# Patient Record
Sex: Female | Born: 1995 | Race: Black or African American | Hispanic: No | Marital: Single | Smoking: Never smoker
Health system: Southern US, Community
[De-identification: ages and names within clinical notes are randomized; demographics above are authoritative.]

## PROBLEM LIST (undated history)

## (undated) DIAGNOSIS — F909 Attention-deficit hyperactivity disorder, unspecified type: Secondary | ICD-10-CM

## (undated) HISTORY — PX: WISDOM TOOTH EXTRACTION: SHX21

---

## 2016-12-07 ENCOUNTER — Emergency Department (HOSPITAL_COMMUNITY): Payer: Worker's Compensation

## 2016-12-07 ENCOUNTER — Encounter (HOSPITAL_COMMUNITY): Payer: Self-pay | Admitting: *Deleted

## 2016-12-07 ENCOUNTER — Emergency Department (HOSPITAL_COMMUNITY)
Admission: EM | Admit: 2016-12-07 | Discharge: 2016-12-07 | Disposition: A | Discharge status: Home / Self Care | Payer: Worker's Compensation | Attending: Emergency Medicine | Admitting: Emergency Medicine

## 2016-12-07 DIAGNOSIS — Y939 Activity, unspecified: Secondary | ICD-10-CM | POA: Insufficient documentation

## 2016-12-07 DIAGNOSIS — S61412A Laceration without foreign body of left hand, initial encounter: Secondary | ICD-10-CM

## 2016-12-07 DIAGNOSIS — Y999 Unspecified external cause status: Secondary | ICD-10-CM | POA: Insufficient documentation

## 2016-12-07 DIAGNOSIS — W25XXXA Contact with sharp glass, initial encounter: Secondary | ICD-10-CM | POA: Diagnosis not present

## 2016-12-07 DIAGNOSIS — Y929 Unspecified place or not applicable: Secondary | ICD-10-CM | POA: Diagnosis not present

## 2016-12-07 HISTORY — DX: Attention-deficit hyperactivity disorder, unspecified type: F90.9

## 2016-12-07 MED ORDER — CEPHALEXIN 500 MG PO CAPS: 500.0000 mg | ORAL_CAPSULE | Freq: Four times a day (QID) | ORAL | 0 refills | Status: DC

## 2016-12-07 MED ORDER — LIDOCAINE HCL 1 % IJ SOLN
INTRAMUSCULAR | Status: AC
  Administered 2016-12-07: 20 mL
  Filled 2016-12-07: qty 20

## 2016-12-07 MED ORDER — BACITRACIN ZINC 500 UNIT/GM EX OINT
TOPICAL_OINTMENT | CUTANEOUS | Status: AC
  Administered 2016-12-07: 1
  Filled 2016-12-07: qty 0.9

## 2016-12-07 NOTE — ED Triage Notes (Signed)
Pt works at [employer].  A glass was dropped on the floor and pt fell on glass slicing her left hand.  Pt denies hitting her head.  Some bleeding still noted.  Pt's hand placed in NaCl.  Pt states her tetanus is up to date.  Pt a/o x 4 and ambulatory.

## 2016-12-07 NOTE — ED Provider Notes (Signed)
[facility]-EMERGENCY DEPT Provider Note   CSN: [id number] Arrival date & time: 12/07/16  2051     History   Chief Complaint No chief complaint on file.   HPI Jodi James is a 21 y.o. female who presents to the ED with a laceration to the left hand. Patient states that she dropped a glass and it broke cutting her hand.   The history is provided by the patient. No language interpreter was used.  Laceration   The incident occurred 1 to 2 hours ago. The laceration is located on the left hand. The laceration is 3 cm in size. The laceration mechanism was a broken glass. The pain is mild. The pain has been constant since onset. Her tetanus status is UTD.    Past Medical History:  Diagnosis Date  . ADHD     There are no active problems to display for this patient.   Past Surgical History:  Procedure Laterality Date  . WISDOM TOOTH EXTRACTION      OB History    No data available       Home Medications    Prior to Admission medications   Medication Sig Start Date End Date Taking? Authorizing Provider  cephALEXin (KEFLEX) 500 MG capsule Take 1 capsule (500 mg total) by mouth 4 (four) times daily. 12/07/16   Janne NapoleonNeese, Traycen Goyer M, NP    Family History No family history on file.  Social History Social History  Substance Use Topics  . Smoking status: Never Smoker  . Smokeless tobacco: Never Used  . Alcohol use Yes     Comment: occasional     Allergies   Sulfa antibiotics   Review of Systems Review of Systems  Musculoskeletal: Positive for arthralgias.  Skin: Positive for wound.  All other systems reviewed and are negative.    Physical Exam Updated Vital Signs BP 108/75 (BP Location: Left Arm)   Pulse 82   Temp 98.6 F (37 C) (Oral)   Resp 18   LMP 12/07/2016   SpO2 100%   Physical Exam  Constitutional: She appears well-developed and well-nourished. No distress.  HENT:  Head: Normocephalic.  Eyes: EOM are normal.  Neck: Neck  supple.  Cardiovascular: Normal rate.   Pulmonary/Chest: Effort normal.  Musculoskeletal: Normal range of motion.       Left hand: She exhibits tenderness and laceration. She exhibits normal range of motion and normal capillary refill. Normal sensation noted. Normal strength noted. She exhibits no thumb/finger opposition.       Hands: There is a laceration to the palmar aspect of the left hand and the base of the left thumb. No tendon visualized. Patient has full flexion and extension of the thumb and good strength.  Neurological: She is alert.  Skin: Skin is warm and dry.  Psychiatric: She has a normal mood and affect.  Nursing note and vitals reviewed.    ED Treatments / Results  Labs (all labs ordered are listed, but only abnormal results are displayed) Labs Reviewed - No data to display   Radiology Dg Hand Complete Left  Result Date: 12/07/2016 CLINICAL DATA:  Laceration adjacent to the left thumb MCP joint. EXAM: LEFT HAND - COMPLETE 3+ VIEW COMPARISON:  None. FINDINGS: There is no evidence of fracture or dislocation. No radiopaque foreign body is identified. There is no evidence of arthropathy or other focal bone abnormality. Soft tissue laceration at the base of the thumb with minimal soft tissue emphysema. IMPRESSION: Negative for acute fracture or  foreign bodies. No joint dislocations. Soft tissue laceration at the base of the thumb. Electronically Signed   By: Tollie Eth M.D.   On: 12/07/2016 22:29    Procedures .Marland KitchenLaceration Repair Date/Time: 12/07/2016 10:41 PM Performed by: Janne Napoleon Authorized by: Janne Napoleon   Consent:    Consent obtained:  Verbal   Consent given by:  Patient   Risks discussed:  Infection and need for additional repair Anesthesia (see MAR for exact dosages):    Anesthesia method:  Local infiltration   Local anesthetic:  Lidocaine 1% w/o epi Laceration details:    Location:  Hand   Hand location:  L palm   Length (cm):  3 Repair type:      Repair type:  Simple Pre-procedure details:    Preparation:  Patient was prepped and draped in usual sterile fashion and imaging obtained to evaluate for foreign bodies Exploration:    Wound exploration: wound explored through full range of motion and entire depth of wound probed and visualized     Wound extent: no foreign bodies/material noted, no nerve damage noted, no tendon damage noted and no underlying fracture noted     Contaminated: no   Treatment:    Area cleansed with:  Betadine   Irrigation solution:  Sterile saline   Irrigation method:  Syringe Skin repair:    Repair method:  Sutures   Suture size:  4-0   Suture material:  Prolene   Suture technique:  Simple interrupted   Number of sutures:  4 Approximation:    Approximation:  Close   Vermilion border: well-aligned   Post-procedure details:    Dressing:  Antibiotic ointment, sterile dressing and splint for protection Comments:     Tetanus is up to date   (including critical care time)  Medications Ordered in ED Medications  lidocaine (XYLOCAINE) 1 % (with pres) injection (20 mLs  Given by Other 12/07/16 2237)  bacitracin 500 UNIT/GM ointment (1 application  Given 12/07/16 2242)     Initial Impression / Assessment and Plan / ED Course  I have reviewed the triage vital signs and the nursing notes. 21 y.o. female with laceration to the left hand stable for d/c without focal neuro deficits. She will f/u in 10 days for suture removal. She will return sooner for any problems.   Final Clinical Impressions(s) / ED Diagnoses   Final diagnoses:  Laceration of left hand without foreign body, initial encounter    New Prescriptions New Prescriptions   CEPHALEXIN (KEFLEX) 500 MG CAPSULE    Take 1 capsule (500 mg total) by mouth 4 (four) times daily.     Kerrie [place], [place] 12/07/16 2252    Rolland Porter, MD 12/26/16 [phone]

## 2016-12-07 NOTE — Discharge Instructions (Signed)
You will need to follow up with your doctor, urgent care or return here in 10 days for suture removal. If you develop numbness, decreased range of motion or other problems follow up with Dr. Magnus IvanBlackman. Take tylenol and ibuprofen as needed for pain.

## 2016-12-17 ENCOUNTER — Emergency Department (HOSPITAL_COMMUNITY)
Admission: EM | Admit: 2016-12-17 | Discharge: 2016-12-17 | Discharge status: Against Medical Advice | Payer: Self-pay | Attending: Emergency Medicine | Admitting: Emergency Medicine

## 2016-12-17 ENCOUNTER — Encounter (HOSPITAL_COMMUNITY): Payer: Self-pay | Admitting: *Deleted

## 2016-12-17 ENCOUNTER — Ambulatory Visit (HOSPITAL_COMMUNITY)
Admission: EM | Admit: 2016-12-17 | Discharge: 2016-12-17 | Disposition: A | Discharge status: Other Acute Inpatient Hospital | Payer: Self-pay

## 2016-12-17 DIAGNOSIS — Z5321 Procedure and treatment not carried out due to patient leaving prior to being seen by health care provider: Secondary | ICD-10-CM | POA: Insufficient documentation

## 2016-12-17 NOTE — ED Triage Notes (Signed)
Pt here for suture removal. Pt has left thumb laceration 10 days ago while at work. Pt states she was unable to bend IP thumb after the laceration and is still unable to bend thumb at IP joint.

## 2016-12-24 ENCOUNTER — Other Ambulatory Visit: Payer: Self-pay | Admitting: Orthopedic Surgery

## 2016-12-25 ENCOUNTER — Encounter (HOSPITAL_BASED_OUTPATIENT_CLINIC_OR_DEPARTMENT_OTHER)
Admission: RE | Disposition: A | Discharge status: Home / Self Care | Payer: Self-pay | Source: Ambulatory Visit | Attending: Orthopedic Surgery

## 2016-12-25 ENCOUNTER — Ambulatory Visit (HOSPITAL_BASED_OUTPATIENT_CLINIC_OR_DEPARTMENT_OTHER)
Admission: RE | Admit: 2016-12-25 | Discharge: 2016-12-25 | Disposition: A | Discharge status: Home / Self Care | Payer: Worker's Compensation | Source: Ambulatory Visit | Attending: Orthopedic Surgery | Admitting: Orthopedic Surgery

## 2016-12-25 ENCOUNTER — Other Ambulatory Visit: Payer: Self-pay

## 2016-12-25 ENCOUNTER — Encounter (HOSPITAL_BASED_OUTPATIENT_CLINIC_OR_DEPARTMENT_OTHER): Payer: Self-pay

## 2016-12-25 ENCOUNTER — Ambulatory Visit (HOSPITAL_BASED_OUTPATIENT_CLINIC_OR_DEPARTMENT_OTHER): Payer: Worker's Compensation | Admitting: Anesthesiology

## 2016-12-25 DIAGNOSIS — W25XXXA Contact with sharp glass, initial encounter: Secondary | ICD-10-CM | POA: Insufficient documentation

## 2016-12-25 DIAGNOSIS — Z881 Allergy status to other antibiotic agents status: Secondary | ICD-10-CM | POA: Diagnosis not present

## 2016-12-25 DIAGNOSIS — S66022A Laceration of long flexor muscle, fascia and tendon of left thumb at wrist and hand level, initial encounter: Secondary | ICD-10-CM | POA: Diagnosis not present

## 2016-12-25 DIAGNOSIS — E669 Obesity, unspecified: Secondary | ICD-10-CM | POA: Diagnosis not present

## 2016-12-25 DIAGNOSIS — S6432XA Injury of digital nerve of left thumb, initial encounter: Secondary | ICD-10-CM | POA: Diagnosis not present

## 2016-12-25 DIAGNOSIS — Z6832 Body mass index (BMI) 32.0-32.9, adult: Secondary | ICD-10-CM | POA: Diagnosis not present

## 2016-12-25 DIAGNOSIS — Z882 Allergy status to sulfonamides status: Secondary | ICD-10-CM | POA: Insufficient documentation

## 2016-12-25 DIAGNOSIS — S61012A Laceration without foreign body of left thumb without damage to nail, initial encounter: Secondary | ICD-10-CM | POA: Diagnosis present

## 2016-12-25 DIAGNOSIS — S65412A Laceration of blood vessel of left thumb, initial encounter: Secondary | ICD-10-CM | POA: Insufficient documentation

## 2016-12-25 HISTORY — PX: NERVE REPAIR: SHX2083

## 2016-12-25 HISTORY — PX: FLEXOR TENDON REPAIR: SHX6501

## 2016-12-25 LAB — POCT PREGNANCY, URINE: Preg Test, Ur: NEGATIVE

## 2016-12-25 SURGERY — REPAIR, TENDON, FLEXOR
Anesthesia: General | Site: Thumb | Laterality: Left

## 2016-12-25 MED ORDER — PROPOFOL 10 MG/ML IV BOLUS
INTRAVENOUS | Status: DC | PRN
  Administered 2016-12-25: 200 mg via INTRAVENOUS

## 2016-12-25 MED ORDER — CEFAZOLIN SODIUM-DEXTROSE 2-4 GM/100ML-% IV SOLN
INTRAVENOUS | Status: AC
  Filled 2016-12-25: qty 100

## 2016-12-25 MED ORDER — PROMETHAZINE HCL 25 MG/ML IJ SOLN: 6.2500 mg | INTRAMUSCULAR | Status: DC | PRN

## 2016-12-25 MED ORDER — FENTANYL CITRATE (PF) 100 MCG/2ML IJ SOLN
INTRAMUSCULAR | Status: AC
  Filled 2016-12-25: qty 2

## 2016-12-25 MED ORDER — CHLORHEXIDINE GLUCONATE 4 % EX LIQD: 60.0000 mL | Freq: Once | CUTANEOUS | Status: DC

## 2016-12-25 MED ORDER — FENTANYL CITRATE (PF) 100 MCG/2ML IJ SOLN: 25.0000 ug | INTRAMUSCULAR | Status: DC | PRN

## 2016-12-25 MED ORDER — OXYCODONE HCL 5 MG PO TABS
5.0000 mg | ORAL_TABLET | Freq: Once | ORAL | Status: AC
  Administered 2016-12-25: 5 mg via ORAL

## 2016-12-25 MED ORDER — DEXAMETHASONE SODIUM PHOSPHATE 10 MG/ML IJ SOLN
INTRAMUSCULAR | Status: DC | PRN
  Administered 2016-12-25: 10 mg via INTRAVENOUS

## 2016-12-25 MED ORDER — FENTANYL CITRATE (PF) 100 MCG/2ML IJ SOLN
INTRAMUSCULAR | Status: DC | PRN
  Administered 2016-12-25: 50 ug via INTRAVENOUS
  Administered 2016-12-25 (×2): 25 ug via INTRAVENOUS
  Administered 2016-12-25: 50 ug via INTRAVENOUS

## 2016-12-25 MED ORDER — ONDANSETRON HCL 4 MG/2ML IJ SOLN
INTRAMUSCULAR | Status: DC | PRN
  Administered 2016-12-25: 4 mg via INTRAVENOUS

## 2016-12-25 MED ORDER — MIDAZOLAM HCL 2 MG/2ML IJ SOLN
INTRAMUSCULAR | Status: AC
  Filled 2016-12-25: qty 2

## 2016-12-25 MED ORDER — MIDAZOLAM HCL 5 MG/5ML IJ SOLN
INTRAMUSCULAR | Status: DC | PRN
  Administered 2016-12-25: 2 mg via INTRAVENOUS

## 2016-12-25 MED ORDER — OXYCODONE HCL 5 MG PO TABS
ORAL_TABLET | ORAL | Status: AC
  Filled 2016-12-25: qty 1

## 2016-12-25 MED ORDER — HYDROCODONE-ACETAMINOPHEN 5-325 MG PO TABS: ORAL_TABLET | ORAL | 0 refills | Status: DC

## 2016-12-25 MED ORDER — LACTATED RINGERS IV SOLN
INTRAVENOUS | Status: DC | PRN
  Administered 2016-12-25 (×2): via INTRAVENOUS

## 2016-12-25 MED ORDER — BUPIVACAINE HCL (PF) 0.25 % IJ SOLN
INTRAMUSCULAR | Status: DC | PRN
  Administered 2016-12-25: 10 mL

## 2016-12-25 MED ORDER — CEFAZOLIN SODIUM-DEXTROSE 2-4 GM/100ML-% IV SOLN
2.0000 g | INTRAVENOUS | Status: AC
  Administered 2016-12-25: 2 g via INTRAVENOUS

## 2016-12-25 MED ORDER — LIDOCAINE HCL (CARDIAC) 20 MG/ML IV SOLN
INTRAVENOUS | Status: DC | PRN
  Administered 2016-12-25: 60 mg via INTRAVENOUS

## 2016-12-25 SURGICAL SUPPLY — 79 items
BAG DECANTER FOR FLEXI CONT (MISCELLANEOUS) IMPLANT
BANDAGE ACE 3X5.8 VEL STRL LF (GAUZE/BANDAGES/DRESSINGS) ×4 IMPLANT
BLADE MINI RND TIP GREEN BEAV (BLADE) IMPLANT
BLADE SURG 15 STRL LF DISP TIS (BLADE) ×4 IMPLANT
BLADE SURG 15 STRL SS (BLADE) ×4
BNDG CONFORM 2 STRL LF (GAUZE/BANDAGES/DRESSINGS) IMPLANT
BNDG ELASTIC 2X5.8 VLCR STR LF (GAUZE/BANDAGES/DRESSINGS) IMPLANT
BNDG ESMARK 4X9 LF (GAUZE/BANDAGES/DRESSINGS) ×4 IMPLANT
BNDG GAUZE ELAST 4 BULKY (GAUZE/BANDAGES/DRESSINGS) ×4 IMPLANT
CATH ROBINSON RED A/P 10FR (CATHETERS) IMPLANT
CHLORAPREP W/TINT 26ML (MISCELLANEOUS) ×4 IMPLANT
CONNECTOR NERVE AXOGUARD 3X15 (Orthopedic Implant) ×4 IMPLANT
CORD BIPOLAR FORCEPS 12FT (ELECTRODE) ×4 IMPLANT
COTTONBALL LRG STERILE PKG (GAUZE/BANDAGES/DRESSINGS) IMPLANT
COVER BACK TABLE 60X90IN (DRAPES) ×4 IMPLANT
COVER MAYO STAND STRL (DRAPES) ×4 IMPLANT
CUFF TOURNIQUET SINGLE 18IN (TOURNIQUET CUFF) ×4 IMPLANT
DECANTER SPIKE VIAL GLASS SM (MISCELLANEOUS) IMPLANT
DRAIN TLS ROUND 10FR (DRAIN) IMPLANT
DRAPE EXTREMITY T 121X128X90 (DRAPE) ×4 IMPLANT
DRAPE OEC MINIVIEW 54X84 (DRAPES) IMPLANT
DRAPE SURG 17X23 STRL (DRAPES) ×4 IMPLANT
DRSG PAD ABDOMINAL 8X10 ST (GAUZE/BANDAGES/DRESSINGS) IMPLANT
GAUZE SPONGE 4X4 12PLY STRL (GAUZE/BANDAGES/DRESSINGS) ×4 IMPLANT
GAUZE SPONGE 4X4 16PLY XRAY LF (GAUZE/BANDAGES/DRESSINGS) IMPLANT
GAUZE XEROFORM 1X8 LF (GAUZE/BANDAGES/DRESSINGS) ×4 IMPLANT
GLOVE BIO SURGEON STRL SZ7.5 (GLOVE) ×4 IMPLANT
GLOVE BIOGEL PI IND STRL 7.0 (GLOVE) ×2 IMPLANT
GLOVE BIOGEL PI IND STRL 8 (GLOVE) ×2 IMPLANT
GLOVE BIOGEL PI IND STRL 8.5 (GLOVE) ×2 IMPLANT
GLOVE BIOGEL PI INDICATOR 7.0 (GLOVE) ×2
GLOVE BIOGEL PI INDICATOR 8 (GLOVE) ×2
GLOVE BIOGEL PI INDICATOR 8.5 (GLOVE) ×2
GLOVE ECLIPSE 6.5 STRL STRAW (GLOVE) ×4 IMPLANT
GLOVE SURG ORTHO 8.0 STRL STRW (GLOVE) ×4 IMPLANT
GOWN STRL REUS W/ TWL LRG LVL3 (GOWN DISPOSABLE) ×2 IMPLANT
GOWN STRL REUS W/TWL LRG LVL3 (GOWN DISPOSABLE) ×2
GOWN STRL REUS W/TWL XL LVL3 (GOWN DISPOSABLE) ×8 IMPLANT
K-WIRE .035X4 (WIRE) IMPLANT
LOOP VESSEL MAXI BLUE (MISCELLANEOUS) IMPLANT
NEEDLE HYPO 25X1 1.5 SAFETY (NEEDLE) ×4 IMPLANT
NEEDLE KEITH (NEEDLE) IMPLANT
NS IRRIG 1000ML POUR BTL (IV SOLUTION) ×4 IMPLANT
PACK BASIN DAY SURGERY FS (CUSTOM PROCEDURE TRAY) ×4 IMPLANT
PAD CAST 3X4 CTTN HI CHSV (CAST SUPPLIES) ×2 IMPLANT
PAD CAST 4YDX4 CTTN HI CHSV (CAST SUPPLIES) IMPLANT
PADDING CAST ABS 3INX4YD NS (CAST SUPPLIES)
PADDING CAST ABS 4INX4YD NS (CAST SUPPLIES) ×2
PADDING CAST ABS COTTON 3X4 (CAST SUPPLIES) IMPLANT
PADDING CAST ABS COTTON 4X4 ST (CAST SUPPLIES) ×2 IMPLANT
PADDING CAST COTTON 3X4 STRL (CAST SUPPLIES) ×2
PADDING CAST COTTON 4X4 STRL (CAST SUPPLIES)
SLEEVE SCD COMPRESS KNEE MED (MISCELLANEOUS) ×4 IMPLANT
SPLINT PLASTER CAST XFAST 3X15 (CAST SUPPLIES) ×30 IMPLANT
SPLINT PLASTER CAST XFAST 4X15 (CAST SUPPLIES) IMPLANT
SPLINT PLASTER XTRA FAST SET 4 (CAST SUPPLIES)
SPLINT PLASTER XTRA FASTSET 3X (CAST SUPPLIES) ×30
STOCKINETTE 4X48 STRL (DRAPES) ×4 IMPLANT
SUT CHROMIC 5 0 P 3 (SUTURE) IMPLANT
SUT ETHIBOND 3-0 V-5 (SUTURE) IMPLANT
SUT ETHILON 3 0 PS 1 (SUTURE) IMPLANT
SUT ETHILON 4 0 PS 2 18 (SUTURE) ×4 IMPLANT
SUT ETHILON 9 0 BV100 4 (SUTURE) ×4 IMPLANT
SUT FIBERWIRE 4-0 18 DIAM BLUE (SUTURE)
SUT MERSILENE 2.0 SH NDLE (SUTURE) IMPLANT
SUT MERSILENE 4 0 P 3 (SUTURE) IMPLANT
SUT PROLENE 2 0 SH DA (SUTURE) IMPLANT
SUT PROLENE 5 0 P 3 (SUTURE) IMPLANT
SUT SILK 4 0 PS 2 (SUTURE) ×4 IMPLANT
SUT SUPRAMID 4-0 (SUTURE) ×4 IMPLANT
SUT VIC AB 4-0 P-3 18XBRD (SUTURE) IMPLANT
SUT VIC AB 4-0 P3 18 (SUTURE)
SUT VICRYL 4-0 PS2 18IN ABS (SUTURE) IMPLANT
SUTURE FIBERWR 4-0 18 DIA BLUE (SUTURE) IMPLANT
SYR BULB 3OZ (MISCELLANEOUS) ×4 IMPLANT
SYR CONTROL 10ML LL (SYRINGE) IMPLANT
TOWEL OR 17X24 6PK STRL BLUE (TOWEL DISPOSABLE) ×8 IMPLANT
TUBE FEEDING ENTERAL 5FR 16IN (TUBING) ×4 IMPLANT
UNDERPAD 30X30 (UNDERPADS AND DIAPERS) ×4 IMPLANT

## 2016-12-25 NOTE — Anesthesia Preprocedure Evaluation (Signed)
Anesthesia Evaluation  Patient identified by MRN, date of birth, ID band Patient awake    Reviewed: Allergy & Precautions, NPO status , Patient's Chart, lab work & pertinent test results  Airway Mallampati: II  TM Distance: >3 FB Neck ROM: Full    Dental  (+) Teeth Intact, Dental Advisory Given   Pulmonary neg pulmonary ROS,    Pulmonary exam normal breath sounds clear to auscultation       Cardiovascular Exercise Tolerance: Good negative cardio ROS Normal cardiovascular exam Rhythm:Regular Rate:Normal     Neuro/Psych negative neurological ROS     GI/Hepatic negative GI ROS, Neg liver ROS,   Endo/Other  Obesity   Renal/GU negative Renal ROS     Musculoskeletal negative musculoskeletal ROS (+)   Abdominal   Peds  (+) ADHD Hematology negative hematology ROS (+)   Anesthesia Other Findings Day of surgery medications reviewed with the patient.  Reproductive/Obstetrics negative OB ROS                             Anesthesia Physical Anesthesia Plan  ASA: II  Anesthesia Plan: General   Post-op Pain Management:    Induction: Intravenous  PONV Risk Score and Plan: 3  Airway Management Planned: LMA  Additional Equipment:   Intra-op Plan:   Post-operative Plan: Extubation in OR  Informed Consent: I have reviewed the patients History and Physical, chart, labs and discussed the procedure including the risks, benefits and alternatives for the proposed anesthesia with the patient or authorized representative who has indicated his/her understanding and acceptance.   Dental advisory given  Plan Discussed with: CRNA  Anesthesia Plan Comments: (Risks/benefits of general anesthesia discussed with patient including risk of damage to teeth, lips, gum, and tongue, nausea/vomiting, allergic reactions to medications, and the possibility of heart attack, stroke and death.  All patient questions  answered.  Patient wishes to proceed.)        Anesthesia Quick Evaluation

## 2016-12-25 NOTE — Transfer of Care (Signed)
Immediate Anesthesia Transfer of Care Note  Patient: Jodi James  Procedure(s) Performed: LEFT THUMB REPAIR FLEXOR TENDON AND DIGITAL NERVE (Left Thumb) NERVE REPAIR (Left Finger)  Patient Location: PACU  Anesthesia Type:General  Level of Consciousness: awake and sedated  Airway & Oxygen Therapy: Patient Spontanous Breathing and Patient connected to face mask oxygen  Post-op Assessment: Report given to RN and Post -op Vital signs reviewed and stable  Post vital signs: Reviewed and stable  Last Vitals:  Vitals:   12/25/16 1246  BP: (!) 101/50  Pulse: 67  Resp: 18  Temp: 36.9 C  SpO2: 100%    Last Pain:  Vitals:   12/25/16 1246  PainSc: 0-No pain         Complications: No apparent anesthesia complications

## 2016-12-25 NOTE — H&P (Signed)
  Jodi James is an 21 y.o. female.   Chief Complaint: left thumb laceration HPI: 21 yo rhd female states she lacerated left thumb at work 12/07/16 on glass.  Seen at ED where wound cleaned and sutured.  Unable to flex ip joint.  Decreased sensation on radial side of thumb.  She wishes to have operative exploration and repair tendon/artery/nerve as necessary.  Allergies:  Allergies  Allergen Reactions  . Sulfa Antibiotics Swelling    Lips swell    Past Medical History:  Diagnosis Date  . ADHD     Past Surgical History:  Procedure Laterality Date  . WISDOM TOOTH EXTRACTION      Family History: History reviewed. No pertinent family history.  Social History:   reports that  has never smoked. she has never used smokeless tobacco. She reports that she drinks alcohol. She reports that she does not use drugs.  Medications: Medications Prior to Admission  Medication Sig Dispense Refill  . cephALEXin (KEFLEX) 500 MG capsule Take 1 capsule (500 mg total) by mouth 4 (four) times daily. 20 capsule 0    No results found for this or any previous visit (from the past 48 hour(s)).  No results found.   A comprehensive review of systems was negative.  Blood pressure (!) 101/50, pulse 67, temperature 98.4 F (36.9 C), resp. rate 18, height 5\' 2"  (1.575 m), weight 81.5 kg (179 lb 9.6 oz), last menstrual period 12/07/2016, SpO2 100 %.  General appearance: alert, cooperative and appears stated age Head: Normocephalic, without obvious abnormality, atraumatic Neck: supple, symmetrical, trachea midline Resp: clear to auscultation bilaterally Cardio: regular rate and rhythm GI: non-tender Extremities: Intact sensation and capillary refill all digits.  +epl/fpl/io.  Healed laceration base left thumb.  Unable to flex ip joint left thumb.  Decreased sensation radial side left thumb. Pulses: 2+ and symmetric Skin: Skin color, texture, turgor normal. No rashes or lesions Neurologic: Grossly  normal Incision/Wound: as above  Assessment/Plan Left thumb laceration with tendon and digital nerve lacerations.  Recommend OR for exploration with repair tendon/artery/nerve as necessary.  Risks, benefits, and alternatives of surgery were discussed and the patient agrees with the plan of care.   Ziquan Fidel R 12/25/2016, 3:01 PM

## 2016-12-25 NOTE — Op Note (Signed)
[id number] 

## 2016-12-25 NOTE — Op Note (Signed)
I assisted Surgeon(s) and Role:    * Betha LoaKuzma, Kevin, MD - Primary    Cindee Salt* Kalee Broxton, MD - Assisting on the Procedure(s): LEFT THUMB REPAIR FLEXOR TENDON AND DIGITAL NERVE NERVE REPAIR on 12/25/2016.  I provided assistance on this case as follows: setup approach, debridement, identification of the laceration artery nerve and tendon, identification of the proximal tendon, repair of artery, tendon and nerve, including use of the operative microscope,closure of the wound, application of dressings and splints. I was present for the entire case.  Electronically signed by: Nicki ReaperKUZMA,Cerise Lieber R, MD Date: 12/25/2016 Time: 4:37 PM

## 2016-12-25 NOTE — Brief Op Note (Signed)
12/25/2016  4:35 PM  PATIENT:  Jodi James  21 y.o. female  PRE-OPERATIVE DIAGNOSIS:  Left thumb laceration  POST-OPERATIVE DIAGNOSIS:  Left thumb laceration  PROCEDURE:  Procedure(s): LEFT THUMB REPAIR FLEXOR TENDON AND DIGITAL NERVE (Left) NERVE REPAIR (Left)  SURGEON:  Surgeon(s) and Role:    * Betha LoaKuzma, Krithi Bray, MD - Primary    * Cindee SaltKuzma, Gary, MD - Assisting  PHYSICIAN ASSISTANT:   ASSISTANTS: Cindee SaltGary Wahid Holley, MD   ANESTHESIA:   general  EBL:  5 mL   BLOOD ADMINISTERED:none  DRAINS: none   LOCAL MEDICATIONS USED:  MARCAINE     SPECIMEN:  No Specimen  DISPOSITION OF SPECIMEN:  N/A  COUNTS:  YES  TOURNIQUET:   Total Tourniquet Time Documented: Upper Arm (Left) - 70 minutes Total: Upper Arm (Left) - 70 minutes   DICTATION: .Other Dictation: Dictation Number [id number]  PLAN OF CARE: Discharge to home after PACU  PATIENT DISPOSITION:  PACU - hemodynamically stable.

## 2016-12-25 NOTE — Anesthesia Procedure Notes (Signed)
Procedure Name: LMA Insertion Date/Time: 12/25/2016 3:09 PM Performed by: Sheryn BisonBlocker, Sanvika Cuttino D, CRNA Pre-anesthesia Checklist: Patient identified, Emergency Drugs available, Suction available and Patient being monitored Patient Re-evaluated:Patient Re-evaluated prior to induction Oxygen Delivery Method: Circle system utilized Preoxygenation: Pre-oxygenation with 100% oxygen Induction Type: IV induction Ventilation: Mask ventilation without difficulty LMA: LMA inserted LMA Size: 3.0 Number of attempts: 1 Airway Equipment and Method: Bite block Placement Confirmation: positive ETCO2 Tube secured with: Tape Dental Injury: Teeth and Oropharynx as per pre-operative assessment

## 2016-12-25 NOTE — Discharge Instructions (Addendum)

## 2016-12-26 ENCOUNTER — Encounter (HOSPITAL_BASED_OUTPATIENT_CLINIC_OR_DEPARTMENT_OTHER): Payer: Self-pay | Admitting: Orthopedic Surgery

## 2016-12-26 NOTE — Anesthesia Postprocedure Evaluation (Signed)
Anesthesia Post Note  Patient: Jodi James  Procedure(s) Performed: LEFT THUMB REPAIR FLEXOR TENDON AND DIGITAL NERVE (Left Thumb) NERVE REPAIR (Left Finger)     Patient location during evaluation: PACU Anesthesia Type: General Level of consciousness: awake and alert Pain management: pain level controlled Vital Signs Assessment: post-procedure vital signs reviewed and stable Respiratory status: spontaneous breathing, nonlabored ventilation and respiratory function stable Cardiovascular status: blood pressure returned to baseline and stable Postop Assessment: no apparent nausea or vomiting Anesthetic complications: no    Last Vitals:  Vitals:   12/25/16 1715 12/25/16 1731  BP: 117/78 117/64  Pulse: 97 92  Resp: (!) 22 (!) 22  Temp:  36.8 C  SpO2: 99% 100%    Last Pain:  Vitals:   12/25/16 1715  PainSc: 7                  Cecile HearingStephen Edward Lolly Glaus

## 2016-12-26 NOTE — Op Note (Signed)
NAMVern Claude:  Kropf, Florean                 ACCOUNT NO.:  [id number]  MEDICAL RECORD NO.:  [id number]  LOCATION:                                 FACILITY:  PHYSICIAN:  Betha LoaKevin Donette Mainwaring, MD        DATE OF BIRTH:  1996/02/14  DATE OF PROCEDURE:  12/25/2016 DATE OF DISCHARGE:                              OPERATIVE REPORT   PREOPERATIVE DIAGNOSES:  Left thumb flexor tendon laceration and digital nerve laceration.  POSTOPERATIVE DIAGNOSES:  Left thumb flexor pollicis longus laceration zone 2, radial digital nerve laceration, and radial digital artery laceration.  PROCEDURES:   1. Left thumb repair of FPL zone 2 laceration 2. Left thumb repair of radial digital artery under microscope 3. Left thumb repair of radial digital nerve under microscope with nerve tube.  SURGEON:  Betha LoaKevin Isobella Ascher, MD.  ASSISTANT:  Cindee SaltGary Teandre Hamre, MD.  ANESTHESIA:  General.  IV FLUIDS:  Per anesthesia flow sheet.  ESTIMATED BLOOD LOSS:  Minimal.  COMPLICATIONS:  None.  SPECIMENS:  None.  TOURNIQUET TIME:  70 minutes.  DISPOSITION:  Stable to PACU.  INDICATIONS:  Ms. Jodi James is a 21 year old female who approximately 2 weeks ago sustained a laceration to the base of her left thumb.  She was seen at the emergency department where the wound was irrigated and sutured closed.  She has had inability to flex the IP joint of the thumb and decreased sensation on the radial side.  I recommended operative exploration with repair of tendon, artery, and nerve as necessary. Risks, benefits, and alternatives of surgery were discussed including the risk of blood loss, infection, damage to nerves, vessels, tendons, ligaments, and bone, failure of surgery, need for additional surgery, complications with wound healing, continued pain, and stiffness.  She voiced understanding of these risks and elected to proceed.  OPERATIVE COURSE:  After being identified preoperatively by myself, the patient and I agreed upon the procedure and site of  procedure.  Surgical site was marked.  The risks, benefits, and alternatives of surgery were reviewed and she wished to proceed.  Surgical consent had been signed. She was given IV Ancef as preoperative antibiotic prophylaxis.  She was transferred to the operating room and placed on the operating room table in supine position with the left upper extremity on an arm board. General anesthesia was induced by anesthesiologist.  The left upper extremity was prepped and draped in a normal sterile orthopedic fashion. Surgical pause was performed between surgeons, Anesthesia, and operating room staff; and all were in agreement as to the patient, procedure and site of procedure.  Tourniquet at the proximal aspect of the extremity was inflated to 250 mmHg after exsanguination of the limb with an Esmarch bandage.  Incision was made in a Brunner-type fashion including the traumatic portion of the wound.  This was carried into subcutaneous tissues by spreading technique.  Complete laceration of the FPL tendon was noted.  The radial digital nerve and artery were lacerated.  The ulnar digital nerve was intact.  The FPL tendon was able to be retrieved from an incision at the wrist.  It was then able to be passed back through its course with a  pediatric feeding tube as a guide.  The FPL tendon was then repaired in a modified Kessler technique using a 4-0 looped Supramid suture.  This was augmented with a running epitendinous 6-0 Prolene suture.  Good reposition was obtained.  The tendon was able to glide within the sheath appropriately.  The microscope was brought in.  The radial digital artery was identified.  It was trimmed back to appropriate ends.  The lumen was opened.  A 9-0 nylon suture was used in an interrupted circumferential fashion to reapproximate the ends.  The ends of the radial digital nerve were freshened.  This was not able to be reapproximated without undue tension.  An AxoGen nerve tube  was then selected and the nerve placed into the tube and sutured in place.  The tube was filled with sterile saline.  The wounds were then copiously irrigated with sterile saline and closed with 4-0 nylon in a horizontal mattress fashion.  The wounds were injected with 10 mL of 0.25% plain Marcaine to aid in postoperative analgesia.  They were dressed with sterile Xeroform and 4 x 4's and wrapped with a Kerlix bandage.  A dorsal blocking splint with the wrist at 30-40 degrees flexion and the thumb flexed at the MP was placed and wrapped with Kerlix and Ace bandage.  Tourniquet was deflated at 70 minutes.  Fingertips were pink with brisk capillary refill after deflation of tourniquet.  The operative drapes were broken down and the patient was awoken from anesthesia safely.  She was transferred back to stretcher and taken to PACU in stable condition.  I will see her back in the office in 1 week for postoperative followup.  I will give her Norco 5/325 one to two p.o. q.6 hours p.r.n. pain, dispensed #30.     Betha LoaKevin Sabir Charters, MD     KK/MEDQ  D:  12/25/2016  T:  12/26/2016  Job:  [id number]

## 2017-07-10 ENCOUNTER — Encounter (HOSPITAL_COMMUNITY): Payer: Self-pay | Admitting: Emergency Medicine

## 2017-07-10 ENCOUNTER — Other Ambulatory Visit: Payer: Self-pay

## 2017-07-10 ENCOUNTER — Emergency Department (HOSPITAL_COMMUNITY)
Admission: EM | Admit: 2017-07-10 | Discharge: 2017-07-11 | Disposition: A | Discharge status: Home / Self Care | Payer: BLUE CROSS/BLUE SHIELD | Attending: Emergency Medicine | Admitting: Emergency Medicine

## 2017-07-10 DIAGNOSIS — E860 Dehydration: Secondary | ICD-10-CM | POA: Insufficient documentation

## 2017-07-10 DIAGNOSIS — R51 Headache: Secondary | ICD-10-CM | POA: Insufficient documentation

## 2017-07-10 DIAGNOSIS — R42 Dizziness and giddiness: Secondary | ICD-10-CM | POA: Diagnosis present

## 2017-07-10 LAB — COMPREHENSIVE METABOLIC PANEL
ALT: 17 U/L (ref 14–54)
AST: 22 U/L (ref 15–41)
Albumin: 3.9 g/dL (ref 3.5–5.0)
Alkaline Phosphatase: 53 U/L (ref 38–126)
Anion gap: 9 (ref 5–15)
BUN: 11 mg/dL (ref 6–20)
CHLORIDE: 108 mmol/L (ref 101–111)
CO2: 22 mmol/L (ref 22–32)
Calcium: 9.1 mg/dL (ref 8.9–10.3)
Creatinine, Ser: 0.81 mg/dL (ref 0.44–1.00)
Glucose, Bld: 112 mg/dL — ABNORMAL HIGH (ref 65–99)
Potassium: 3.4 mmol/L — ABNORMAL LOW (ref 3.5–5.1)
Sodium: 139 mmol/L (ref 135–145)
Total Bilirubin: 0.3 mg/dL (ref 0.3–1.2)
Total Protein: 7.8 g/dL (ref 6.5–8.1)

## 2017-07-10 LAB — CBC
HCT: 35.1 % — ABNORMAL LOW (ref 36.0–46.0)
Hemoglobin: 11.3 g/dL — ABNORMAL LOW (ref 12.0–15.0)
MCH: 28.8 pg (ref 26.0–34.0)
MCHC: 32.2 g/dL (ref 30.0–36.0)
MCV: 89.5 fL (ref 78.0–100.0)
PLATELETS: 316 10*3/uL (ref 150–400)
RBC: 3.92 MIL/uL (ref 3.87–5.11)
RDW: 13.1 % (ref 11.5–15.5)
WBC: 7.3 10*3/uL (ref 4.0–10.5)

## 2017-07-10 LAB — LIPASE, BLOOD: LIPASE: 25 U/L (ref 11–51)

## 2017-07-10 LAB — URINALYSIS, ROUTINE W REFLEX MICROSCOPIC
BILIRUBIN URINE: NEGATIVE
Glucose, UA: NEGATIVE mg/dL
HGB URINE DIPSTICK: NEGATIVE
KETONES UR: NEGATIVE mg/dL
LEUKOCYTES UA: NEGATIVE
Nitrite: NEGATIVE
PH: 6 (ref 5.0–8.0)
PROTEIN: NEGATIVE mg/dL
Specific Gravity, Urine: 1.021 (ref 1.005–1.030)

## 2017-07-10 LAB — I-STAT BETA HCG BLOOD, ED (MC, WL, AP ONLY): I-stat hCG, quantitative: <5 m[IU]/mL (ref <5)

## 2017-07-10 MED ORDER — SODIUM CHLORIDE 0.9 % IV BOLUS
1000.0000 mL | Freq: Once | INTRAVENOUS | Status: AC
  Administered 2017-07-10: 1000 mL via INTRAVENOUS

## 2017-07-10 NOTE — ED Triage Notes (Signed)
Pt reports she went hiking on Wed and ever since had intermittent dizziness. Repots had headache earlier before she ate and went away after eating. Denies any n/v/d.

## 2017-07-10 NOTE — ED Provider Notes (Signed)
[facility]-EMERGENCY DEPT Provider Note   CSN: [id number] Arrival date & time: 07/10/17  1833     History   Chief Complaint Chief Complaint  Patient presents with  . Dizziness    HPI Jodi James is a 22 y.o. female.  The history is provided by the patient and medical records. No language interpreter was used.  Dizziness  Quality:  Lightheadedness Severity:  Mild Onset quality:  Gradual Duration:  2 days Timing:  Sporadic Progression:  Waxing and waning Chronicity:  New Context: physical activity   Context: not with loss of consciousness   Relieved by:  Nothing Worsened by:  Standing up Ineffective treatments:  None tried Associated symptoms: headaches (mild)   Associated symptoms: no chest pain, no diarrhea, no nausea, no shortness of breath, no syncope, no vision changes, no vomiting and no weakness   Risk factors: no hx of stroke and no new medications     Past Medical History:  Diagnosis Date  . ADHD     There are no active problems to display for this patient.   Past Surgical History:  Procedure Laterality Date  . FLEXOR TENDON REPAIR Left 12/25/2016   Procedure: LEFT THUMB REPAIR FLEXOR TENDON AND DIGITAL NERVE;  Surgeon: Betha Loa, MD;  Location: [facility];  Service: Orthopedics;  Laterality: Left;  . NERVE REPAIR Left 12/25/2016   Procedure: NERVE REPAIR;  Surgeon: Betha Loa, MD;  Location: [facility];  Service: Orthopedics;  Laterality: Left;  . WISDOM TOOTH EXTRACTION       OB History   None      Home Medications    Prior to Admission medications   Medication Sig Start Date End Date Taking? Authorizing Provider  cephALEXin (KEFLEX) 500 MG capsule Take 1 capsule (500 mg total) by mouth 4 (four) times daily. 12/07/16   Janne Napoleon, NP  HYDROcodone-acetaminophen [facility]) 5-325 MG tablet 1-2 tabs po q6 hours prn pain 12/25/16   Betha Loa, MD    Family History No family history on  file.  Social History Social History   Tobacco Use  . Smoking status: Never Smoker  . Smokeless tobacco: Never Used  Substance Use Topics  . Alcohol use: Yes    Comment: occasional  . Drug use: No     Allergies   Sulfa antibiotics   Review of Systems Review of Systems  Constitutional: Negative for chills, diaphoresis, fatigue and fever.  HENT: Negative for congestion.   Eyes: Negative for visual disturbance.  Respiratory: Negative for chest tightness and shortness of breath.   Cardiovascular: Negative for chest pain and syncope.  Gastrointestinal: Negative for abdominal pain, diarrhea, nausea and vomiting.  Musculoskeletal: Negative for back pain, neck pain and neck stiffness.  Skin: Negative for rash and wound.  Neurological: Positive for light-headedness and headaches (mild). Negative for dizziness, seizures, syncope, facial asymmetry, speech difficulty, weakness and numbness.  Psychiatric/Behavioral: Negative for agitation.  All other systems reviewed and are negative.    Physical Exam Updated Vital Signs BP (!) 104/58   Pulse 72   Temp 98 F (36.7 C) (Oral)   Resp 18   Ht  (1.575 m)   Wt 81.6 kg (180 lb)   SpO2 100%   BMI 32.92 kg/m   Physical Exam  Constitutional: She is oriented to person, place, and time. She appears well-developed and well-nourished. No distress.  HENT:  Head: Normocephalic and atraumatic.  Nose: Nose normal.  Mouth/Throat: Oropharynx is clear and  moist. No oropharyngeal exudate.  Eyes: Pupils are equal, round, and reactive to light. Conjunctivae and EOM are normal.  Neck: Normal range of motion. Neck supple.  Cardiovascular: Normal rate and regular rhythm.  No murmur heard. Pulmonary/Chest: Effort normal and breath sounds normal. No respiratory distress. She has no wheezes. She has no rales. She exhibits no tenderness.  Abdominal: Soft. She exhibits no distension. There is no tenderness.  Musculoskeletal: She exhibits no edema  or tenderness.  Lymphadenopathy:    She has no cervical adenopathy.  Neurological: She is alert and oriented to person, place, and time. No cranial nerve deficit or sensory deficit. She exhibits normal muscle tone. Coordination normal.  Skin: Skin is warm and dry. Capillary refill takes less than 2 seconds. No rash noted. She is not diaphoretic. No erythema.  Psychiatric: She has a normal mood and affect.  Nursing note and vitals reviewed.    ED Treatments / Results  Labs (all labs ordered are listed, but only abnormal results are displayed) Labs Reviewed  COMPREHENSIVE METABOLIC PANEL - Abnormal; Notable for the following components:      Result Value   Potassium 3.4 (*)    Glucose, Bld 112 (*)    All other components within normal limits  CBC - Abnormal; Notable for the following components:   Hemoglobin 11.3 (*)    HCT 35.1 (*)    All other components within normal limits  LIPASE, BLOOD  URINALYSIS, ROUTINE W REFLEX MICROSCOPIC  I-STAT BETA HCG BLOOD, ED ([facility], [facility], [facility] ONLY)    EKG None  Radiology No results found.  Procedures Procedures (including critical care time)  Medications Ordered in ED Medications  sodium chloride 0.9 % bolus 1,000 mL (0 mLs Intravenous Stopped 07/11/17 0006)     Initial Impression / Assessment and Plan / ED Course  I have reviewed the triage vital signs and the nursing notes.  Pertinent labs & imaging results that were available during my care of the patient were reviewed by me and considered in my medical decision making (see chart for details).     Jodi James is a 22 y.o. female with a past medical history of ADHD who presents for lightheadedness.  Patient says that she went on a hike several days ago and since that time has been feeling very dehydrated.  She is feeling lightheaded and thinks she overexerted herself in the heat during her hike.  She says she has not has much to eat or drink since then but has not had syncopal episodes.   She feels occasionally lightheaded.  She also reports for mild headache.  She denies any tick exposures or new medications.  She denies any injuries.  She denies nausea vomiting, conservation, diarrhea, vision changes or any neurologic deficits.  She is otherwise feeling well.  No chest pain shortness of breath palpitations or preceding symptoms such as discomfort before her lightheaded episodes.  On exam, patient appears well.  No focal neurologic deficit seen.  Normal gait.  Lungs clear and chest nontender.  No murmurs.  Abdomen nontender.  Patient has symmetric pulses in all extremities.  No focal neurologic deficit seen.  Suspect dehydration given the patient's outside exertion and hike.  Patient given IV fluids and had screening laboratory testing performed.  Laboratory testing was overall reassuring and patient appeared well.  Patient felt much better after fluids and no longer felt lightheaded.  Patient will follow-up with her PCP for reassessment and further management.  Do not feel patient needs  admission or further work-up at this time.  Patient felt well and was discharged in good condition after rehydration.   Final Clinical Impressions(s) / ED Diagnoses   Final diagnoses:  Dehydration  Lightheadedness    ED Discharge Orders    None     Clinical Impression: 1. Dehydration   2. Lightheadedness     Disposition: Discharge  Condition: Good  I have discussed the results, Dx and Tx plan with the pt(& family if present). He/she/they expressed understanding and agree(s) with the plan. Discharge instructions discussed at great length. Strict return precautions discussed and pt &/or family have verbalized understanding of the instructions. No further questions at time of discharge.    Discharge Medication List as of 07/10/2017 11:37 PM      Follow Up: [facility] [address] [place] [address] [phone] Schedule an  appointment as soon as possible for a visit    [facility] [address] [id number] [place] [address] [phone]       Abdi Husak, Canary Brim, MD 07/11/17 [phone]

## 2017-07-10 NOTE — Discharge Instructions (Signed)
Your work-up today was overall reassuring.  We suspect your lightheaded spells are due to the dehydration and being in the heat on your hiking recently.  We did not find evidence of acute infection or other significant electrolyte abnormalities.  Please stay hydrated and follow-up with a primary doctor for reassessment further management.  If any symptoms change or worsen, please return to the nearest emergency department.

## 2018-08-01 IMAGING — DX DG HAND COMPLETE 3+V*L*
3 series · 3 of 3 positions shown · non-contrast
Comparison: None.

CLINICAL DATA: Laceration adjacent to the left thumb MCP joint.

EXAM:
LEFT HAND - COMPLETE 3+ VIEW

[hand ap]
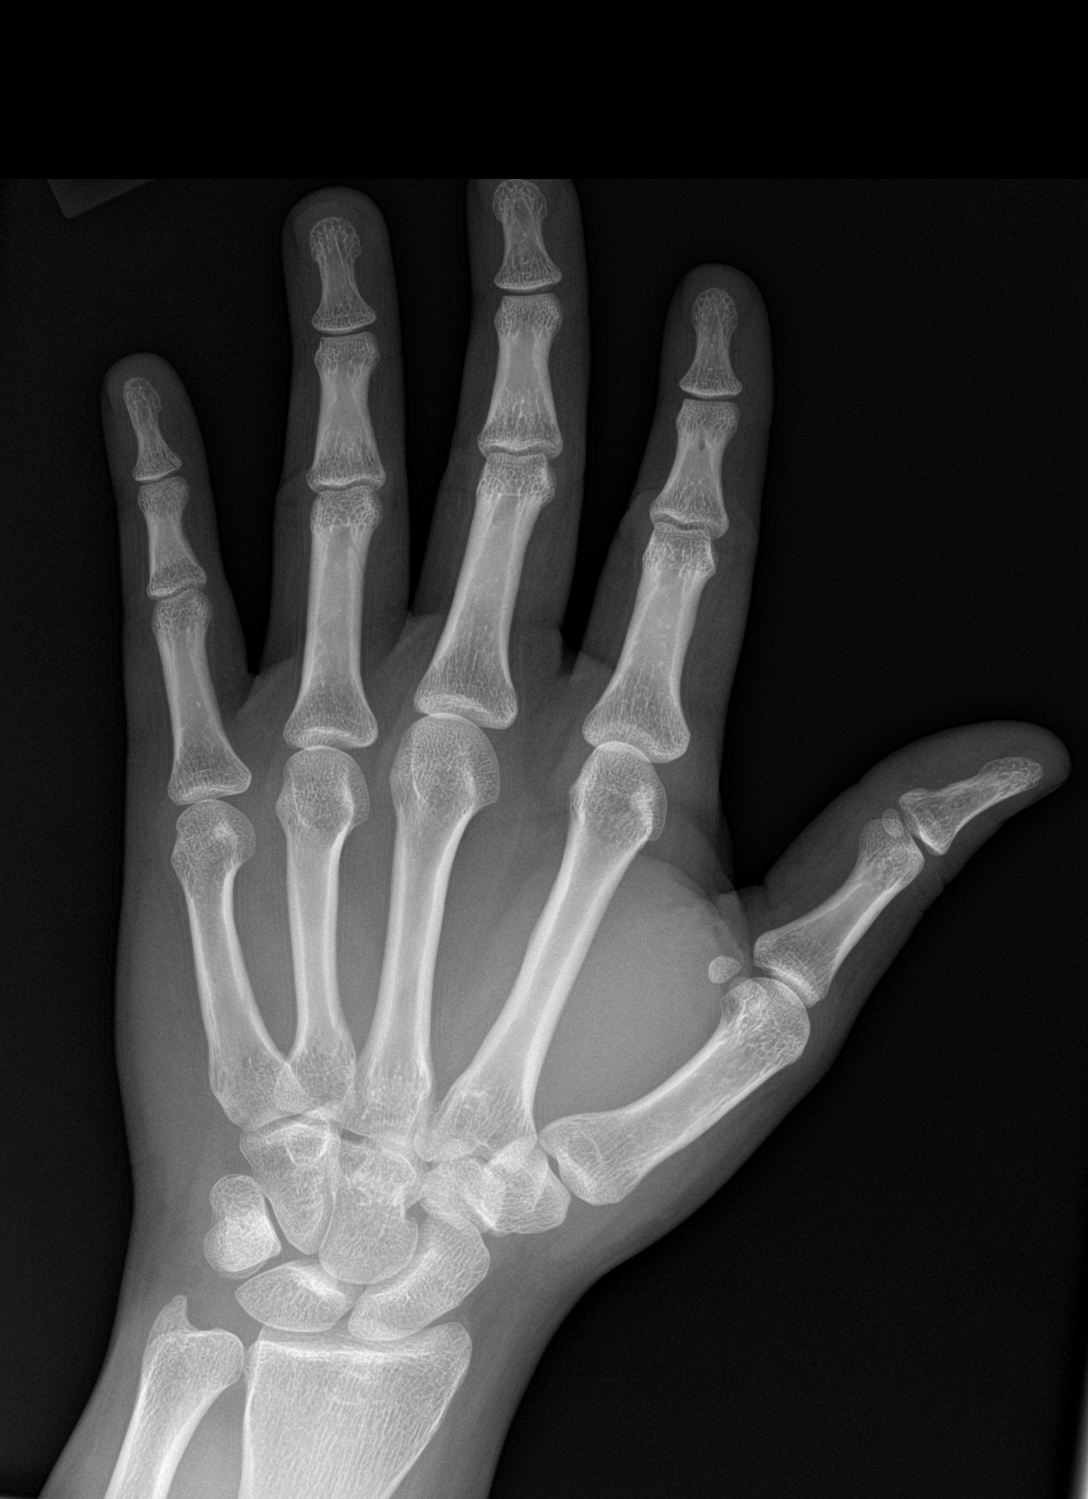

[hand obl]
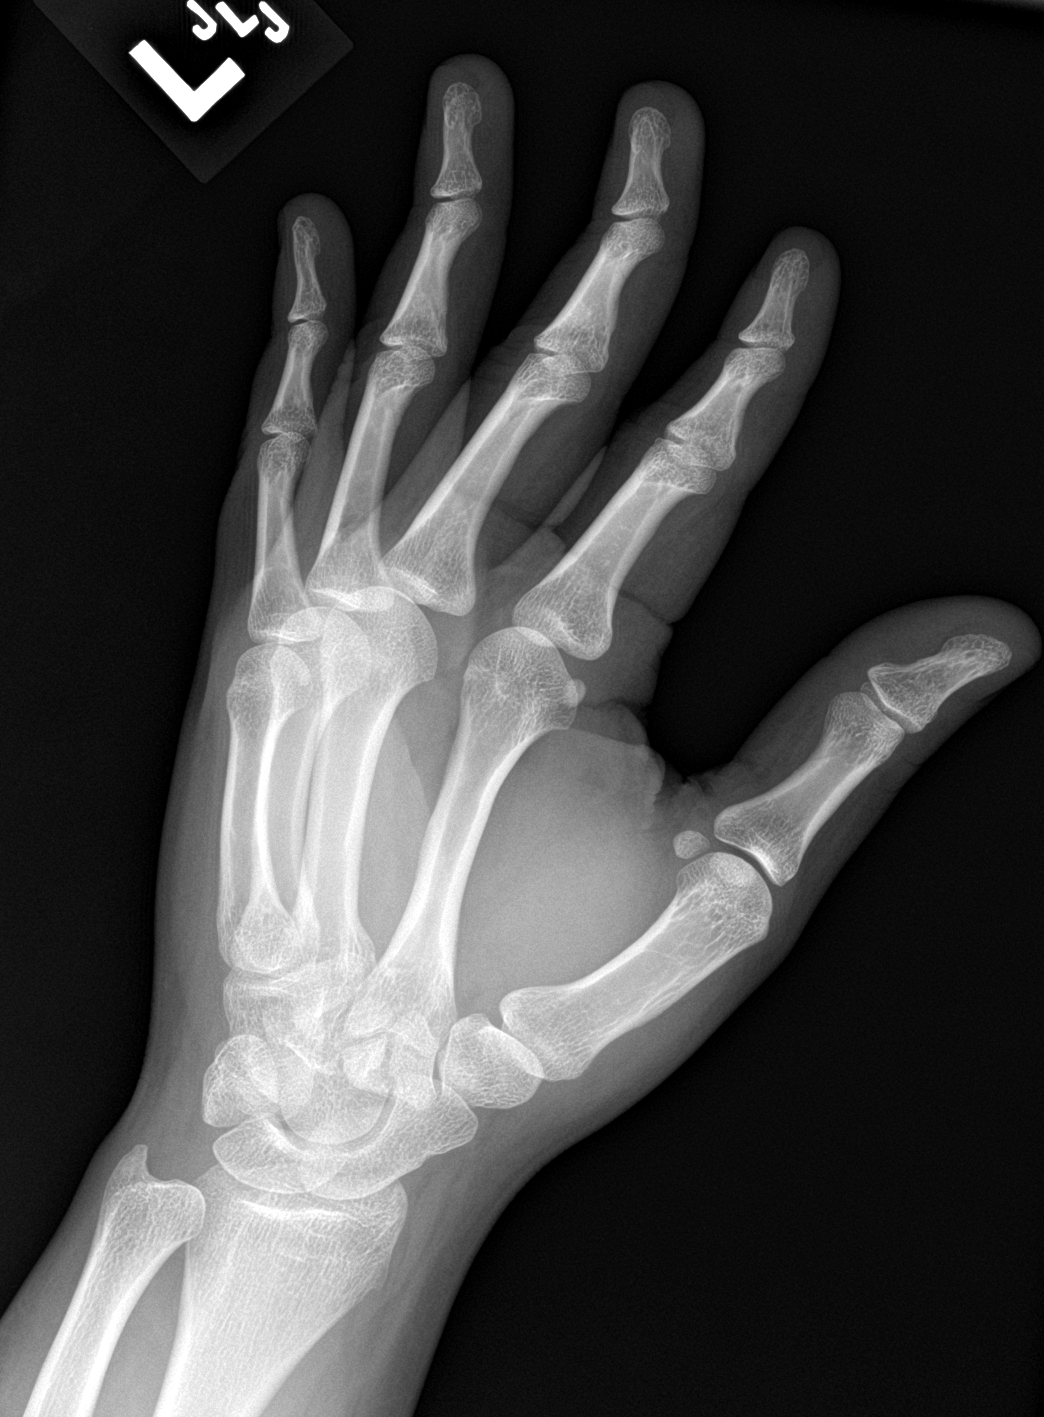

[hand lat]
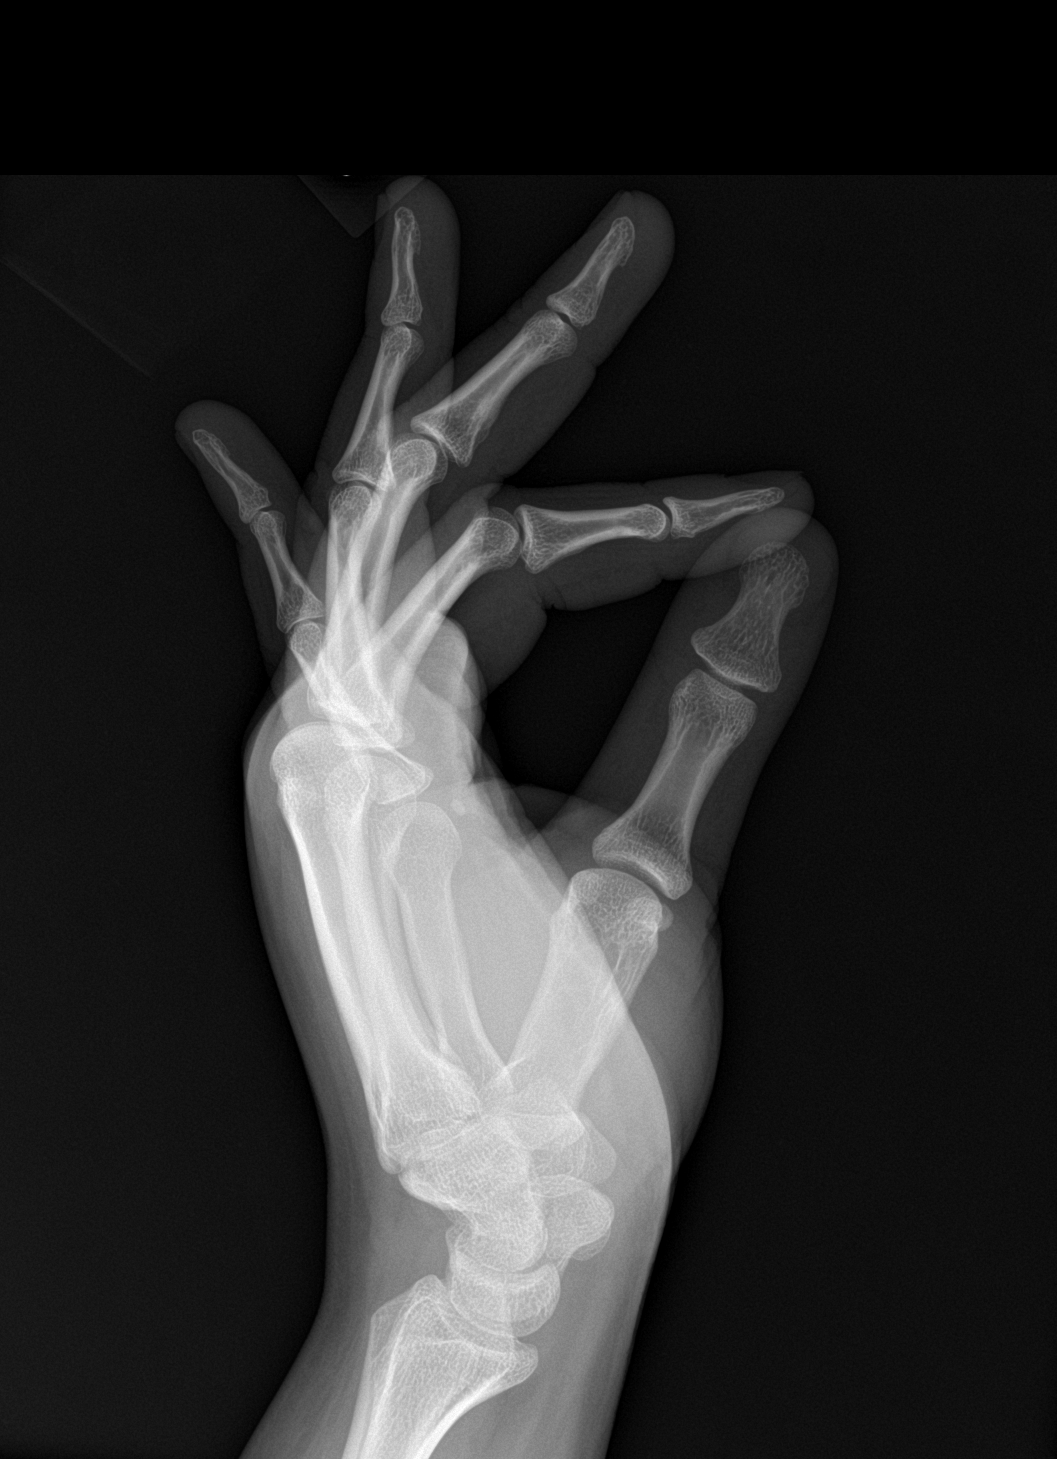

[3 of 3 positions shown; findings below may reference images not displayed]

FINDINGS: There is no evidence of fracture or dislocation. No radiopaque
foreign body is identified. There is no evidence of arthropathy or
other focal bone abnormality. Soft tissue laceration at the base of
the thumb with minimal soft tissue emphysema.
IMPRESSION: Negative for acute fracture or foreign bodies. No joint
dislocations. Soft tissue laceration at the base of the thumb.

## 2022-03-06 ENCOUNTER — Ambulatory Visit (INDEPENDENT_AMBULATORY_CARE_PROVIDER_SITE_OTHER): Payer: BC Managed Care – PPO | Admitting: Family Medicine

## 2022-03-06 VITALS — BP 102/64 | HR 83 | Wt 217.0 lb

## 2022-03-06 DIAGNOSIS — F419 Anxiety disorder, unspecified: Secondary | ICD-10-CM | POA: Diagnosis not present

## 2022-03-06 DIAGNOSIS — Z7689 Persons encountering health services in other specified circumstances: Secondary | ICD-10-CM

## 2022-03-06 DIAGNOSIS — R0981 Nasal congestion: Secondary | ICD-10-CM

## 2022-03-06 DIAGNOSIS — F909 Attention-deficit hyperactivity disorder, unspecified type: Secondary | ICD-10-CM | POA: Insufficient documentation

## 2022-03-06 MED ORDER — GUAIFENESIN ER 600 MG PO TB12: 600.0000 mg | ORAL_TABLET | Freq: Two times a day (BID) | ORAL | 0 refills | Status: AC

## 2022-03-06 MED ORDER — HYDROXYZINE HCL 10 MG PO TABS: 25.0000 mg | ORAL_TABLET | Freq: Three times a day (TID) | ORAL | 0 refills | Status: AC | PRN

## 2022-03-06 NOTE — Progress Notes (Signed)
Subjective:    Patient ID: Makell Drohan, female    DOB: 05/10/1995, 27 y.o.   MRN: [id number]   CC: Establish care  HPI:  Athelene Hursey is a very pleasant 27 y.o. female who presents today to establish care.  Initial concerns:  Congestion/mucus  Congestion started in October when she moved here. She would have intermittent "dizziness" or "fuzzy feeling" at night. This did a tele-doc appointment, was diagnosed with an ear infection and she was prescribed abx. She never finished her abx. The symptoms went away on their own and then came back a few months later. She thought this was related to temperature change from working out and then going out into the cold. She feels like the dizzy sensation went away but now she just has nasal congestion and congestion in her throat still. She did have a sore throat recently as well.  Anxiety:  Typically happens at night, she thinks about worst case scenarios. She also has anxiety around trips and big events. Has been going on since she moved in October. She has tried to do positive affirmations and prayer, helps somewhat. Sometimes causes her to wake up in the middle of the night- "heart is racing". No current stressors, she has normal work stress with her business. She has good support from family and friends.   Past medical history: Hx missed abortion 05/2021. Termination of pregnancy summer 2023. ADHD   Past surgical history: Left thumb flexor tendon repair 12/2016  Current medications: MVI, melatonin gummies   Family history:  Maternal grandmother: DM, HTN  Social history: Lives with Chloe (dog). 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 Moved back here in October from [place], [place]  ROS: pertinent noted in the HPI   Objective:  BP 102/64   Pulse 83   Wt 217 lb (98.4 kg)   LMP 02/17/2022   SpO2 98%   BMI 39.69 kg/m    Vitals and nursing note reviewed  General: NAD, pleasant, able to participate in exam HEENT:  Normocephalic, nares patent without congestion or rhinorrhea, TM clear b/l Cardiac: RRR, S1 S2 present. normal heart sounds, no murmurs. Respiratory: CTAB, normal effort, No wheezes, rales or rhonchi Neuro: alert, no obvious focal deficits Psych: Normal affect and mood     03/06/2022    1:58 PM  GAD 7 : Generalized Anxiety Score  Nervous, Anxious, on Edge 1  Control/stop worrying 3  Worry too much - different things 1  Trouble relaxing 1  Restless 2  Easily annoyed or irritable 1  Afraid - awful might happen 3  Total GAD 7 Score 12  Anxiety Difficulty Somewhat difficult      03/06/2022    1:59 PM  Depression screen PHQ 2/9  Decreased Interest 0  Down, Depressed, Hopeless 0  PHQ - 2 Score 0  Altered sleeping 2  Tired, decreased energy 0  Change in appetite 0  Feeling bad or failure about yourself  1  Trouble concentrating 0  Moving slowly or fidgety/restless 0  Suicidal thoughts 0  PHQ-9 Score 3  Difficult doing work/chores Not difficult at all     Assessment & Plan:   1. Encounter to establish care with new doctor Reports that her Pap smear was performed in 2022 and was normal. Will need to get records from previous provider. Several HM items that are outstanding- will attempt to catch up at follow up.   2. Nasal congestion Benign examination. No fevers. Lungs clear. Nares clear.  Will trial Mucinex. - guaiFENesin (MUCINEX) 600 MG 12 hr tablet; Take 1 tablet (600 mg total) by mouth 2 (two) times daily for 7 days.  Dispense: 14 tablet; Refill: 0  3. Anxiety Intermittent, mostly at night. No recent stressors that are exacerbating. Could be adjustment reaction since started after move. Will trial PRN medication, encourage therapy- resources provided in AVS. - hydrOXYzine (ATARAX) 10 MG tablet; Take 2.5 tablets (25 mg total) by mouth 3 (three) times daily as needed for anxiety.  Dispense: 90 tablet; Refill: 0  Sharion Settler, DO Family Medicine Resident

## 2022-03-06 NOTE — Patient Instructions (Addendum)
It was wonderful to see you today.  Please bring ALL of your medications with you to every visit.   Today we talked about:  I am sending Mucinex to help with your congestion I am sending an as needed medication called Hydroxyzine that you can use up to three times a day for anxiety. I would encourage you to reach out and find a therapist. See the website below.   To find a therapist visit [email or web address]. At this website you will be able to filter which providers take your click your insurance as well as other filters to fit your needs. For medicine management please call your insurance to find psychiatrist in network   Lets plan to check in with each other in 1 month to see how things are going.   Thank you for coming to your visit as scheduled. We have had a large "no-show" problem lately, and this significantly limits our ability to see and care for patients. As a friendly reminder- if you cannot make your appointment please call to cancel. We do have a no show policy for those who do not cancel within 24 hours. Our policy is that if you miss or fail to cancel an appointment within 24 hours, 3 times in a 49-month period, you may be dismissed from our clinic.   Thank you for choosing [facility].   Please call [phone] with any questions about today's appointment.  Please be sure to schedule follow up at the front  desk before you leave today.   Sharion Settler, DO PGY-3 Family Medicine

## 2022-03-10 ENCOUNTER — Telehealth: Payer: Self-pay | Admitting: Family Medicine

## 2022-03-10 NOTE — Telephone Encounter (Signed)
Patient walked in stating she needs a letter for the apartment complex stating she has anxiety  to support her need for a pet for emotional support. Pt. Ph # [phone]

## 2022-03-12 ENCOUNTER — Other Ambulatory Visit: Payer: Self-pay | Admitting: Family Medicine

## 2022-03-12 DIAGNOSIS — F419 Anxiety disorder, unspecified: Secondary | ICD-10-CM

## 2022-03-12 DIAGNOSIS — F39 Unspecified mood [affective] disorder: Secondary | ICD-10-CM

## 2022-03-12 NOTE — Telephone Encounter (Signed)
Called patient. Discussed that I am unable to write a letter establishing the need for support animal for anxiety at this time. She was understanding. She was amenable to referral to Psychiatry for additional evaluation and management of her anxiety. Has previous hx of ADHD as well, per her report.

## 2022-04-04 ENCOUNTER — Ambulatory Visit: Payer: BC Managed Care – PPO | Admitting: Family Medicine

## 2022-04-04 NOTE — Progress Notes (Deleted)
    SUBJECTIVE:   CHIEF COMPLAINT / HPI:   Jodi James is a 27 y.o. female who presents to the [facility] clinic today to discuss the following concerns:   F/u Anxiety  Last seen to establish care last month. We discussed her anxiety and she was given prescription for PRN hydroxyzine and encouraged to start therapy. [name] presents today for her 1 month follow up. Reports ***    PERTINENT  PMH / PSH: ADHD  OBJECTIVE:   LMP 02/17/2022    General: NAD, pleasant, able to participate in exam Cardiac: RRR, no murmurs. Respiratory: CTAB, normal effort, No wheezes, rales or rhonchi Abdomen: Bowel sounds present, nontender, nondistended, no hepatosplenomegaly. Extremities: no edema or cyanosis. Skin: warm and dry, no rashes noted Neuro: alert, no obvious focal deficits Psych: Normal affect and mood  ASSESSMENT/PLAN:   No problem-specific Assessment & Plan notes found for this encounter.     Jodi James, [place]

## 2022-06-02 ENCOUNTER — Ambulatory Visit (HOSPITAL_COMMUNITY): Payer: Self-pay | Admitting: Psychiatry

## 2022-11-20 DIAGNOSIS — Z3A19 19 weeks gestation of pregnancy: Secondary | ICD-10-CM | POA: Diagnosis not present

## 2022-11-20 DIAGNOSIS — Z361 Encounter for antenatal screening for raised alphafetoprotein level: Secondary | ICD-10-CM | POA: Diagnosis not present

## 2022-11-20 DIAGNOSIS — Z3400 Encounter for supervision of normal first pregnancy, unspecified trimester: Secondary | ICD-10-CM | POA: Diagnosis not present

## 2022-11-20 DIAGNOSIS — Z363 Encounter for antenatal screening for malformations: Secondary | ICD-10-CM | POA: Diagnosis not present

## 2022-12-18 DIAGNOSIS — Z3A23 23 weeks gestation of pregnancy: Secondary | ICD-10-CM | POA: Diagnosis not present

## 2022-12-18 DIAGNOSIS — Z3402 Encounter for supervision of normal first pregnancy, second trimester: Secondary | ICD-10-CM | POA: Diagnosis not present

## 2023-01-08 DIAGNOSIS — Z23 Encounter for immunization: Secondary | ICD-10-CM | POA: Diagnosis not present

## 2023-01-08 DIAGNOSIS — Z3A26 26 weeks gestation of pregnancy: Secondary | ICD-10-CM | POA: Diagnosis not present

## 2023-01-08 DIAGNOSIS — Z3480 Encounter for supervision of other normal pregnancy, unspecified trimester: Secondary | ICD-10-CM | POA: Diagnosis not present

## 2023-01-08 DIAGNOSIS — Z3400 Encounter for supervision of normal first pregnancy, unspecified trimester: Secondary | ICD-10-CM | POA: Diagnosis not present

## 2023-01-18 DIAGNOSIS — Z3A28 28 weeks gestation of pregnancy: Secondary | ICD-10-CM | POA: Diagnosis not present

## 2023-01-18 DIAGNOSIS — Z3689 Encounter for other specified antenatal screening: Secondary | ICD-10-CM | POA: Diagnosis not present

## 2023-01-18 DIAGNOSIS — O322XX0 Maternal care for transverse and oblique lie, not applicable or unspecified: Secondary | ICD-10-CM | POA: Diagnosis not present

## 2023-01-18 DIAGNOSIS — O368130 Decreased fetal movements, third trimester, not applicable or unspecified: Secondary | ICD-10-CM | POA: Diagnosis not present

## 2023-01-22 DIAGNOSIS — Z3403 Encounter for supervision of normal first pregnancy, third trimester: Secondary | ICD-10-CM | POA: Diagnosis not present

## 2023-01-22 DIAGNOSIS — Z3A28 28 weeks gestation of pregnancy: Secondary | ICD-10-CM | POA: Diagnosis not present

## 2023-01-30 DIAGNOSIS — Z3483 Encounter for supervision of other normal pregnancy, third trimester: Secondary | ICD-10-CM | POA: Diagnosis not present

## 2023-01-30 DIAGNOSIS — Z3482 Encounter for supervision of other normal pregnancy, second trimester: Secondary | ICD-10-CM | POA: Diagnosis not present

## 2023-02-04 DIAGNOSIS — Z3483 Encounter for supervision of other normal pregnancy, third trimester: Secondary | ICD-10-CM | POA: Diagnosis not present

## 2023-02-20 DIAGNOSIS — E038 Other specified hypothyroidism: Secondary | ICD-10-CM | POA: Diagnosis not present

## 2023-02-20 DIAGNOSIS — Z6841 Body Mass Index (BMI) 40.0 and over, adult: Secondary | ICD-10-CM | POA: Diagnosis not present

## 2023-02-20 DIAGNOSIS — Z3483 Encounter for supervision of other normal pregnancy, third trimester: Secondary | ICD-10-CM | POA: Diagnosis not present

## 2023-03-10 DIAGNOSIS — O99284 Endocrine, nutritional and metabolic diseases complicating childbirth: Secondary | ICD-10-CM | POA: Diagnosis not present

## 2023-03-10 DIAGNOSIS — Z79890 Hormone replacement therapy (postmenopausal): Secondary | ICD-10-CM | POA: Diagnosis not present

## 2023-03-10 DIAGNOSIS — O99214 Obesity complicating childbirth: Secondary | ICD-10-CM | POA: Diagnosis not present

## 2023-03-10 DIAGNOSIS — Z8759 Personal history of other complications of pregnancy, childbirth and the puerperium: Secondary | ICD-10-CM | POA: Diagnosis not present

## 2023-03-10 DIAGNOSIS — D251 Intramural leiomyoma of uterus: Secondary | ICD-10-CM | POA: Diagnosis not present

## 2023-03-10 DIAGNOSIS — O1493 Unspecified pre-eclampsia, third trimester: Secondary | ICD-10-CM | POA: Diagnosis not present

## 2023-03-10 DIAGNOSIS — E039 Hypothyroidism, unspecified: Secondary | ICD-10-CM | POA: Diagnosis not present

## 2023-03-10 DIAGNOSIS — O99344 Other mental disorders complicating childbirth: Secondary | ICD-10-CM | POA: Diagnosis not present

## 2023-03-10 DIAGNOSIS — Z3483 Encounter for supervision of other normal pregnancy, third trimester: Secondary | ICD-10-CM | POA: Diagnosis not present

## 2023-03-10 DIAGNOSIS — Z98891 History of uterine scar from previous surgery: Secondary | ICD-10-CM | POA: Diagnosis not present

## 2023-03-10 DIAGNOSIS — R0600 Dyspnea, unspecified: Secondary | ICD-10-CM | POA: Diagnosis not present

## 2023-03-10 DIAGNOSIS — D252 Subserosal leiomyoma of uterus: Secondary | ICD-10-CM | POA: Diagnosis not present

## 2023-03-10 DIAGNOSIS — O43813 Placental infarction, third trimester: Secondary | ICD-10-CM | POA: Diagnosis not present

## 2023-03-10 DIAGNOSIS — Z3A35 35 weeks gestation of pregnancy: Secondary | ICD-10-CM | POA: Diagnosis not present

## 2023-03-10 DIAGNOSIS — O3413 Maternal care for benign tumor of corpus uteri, third trimester: Secondary | ICD-10-CM | POA: Diagnosis not present

## 2023-03-10 DIAGNOSIS — F909 Attention-deficit hyperactivity disorder, unspecified type: Secondary | ICD-10-CM | POA: Diagnosis not present

## 2023-03-10 DIAGNOSIS — F419 Anxiety disorder, unspecified: Secondary | ICD-10-CM | POA: Diagnosis not present

## 2023-03-10 DIAGNOSIS — O1414 Severe pre-eclampsia complicating childbirth: Secondary | ICD-10-CM | POA: Diagnosis not present

## 2023-03-10 DIAGNOSIS — O365930 Maternal care for other known or suspected poor fetal growth, third trimester, not applicable or unspecified: Secondary | ICD-10-CM | POA: Diagnosis not present

## 2023-03-16 DIAGNOSIS — R030 Elevated blood-pressure reading, without diagnosis of hypertension: Secondary | ICD-10-CM | POA: Diagnosis not present

## 2023-04-03 DIAGNOSIS — S66022D Laceration of long flexor muscle, fascia and tendon of left thumb at wrist and hand level, subsequent encounter: Secondary | ICD-10-CM | POA: Diagnosis not present

## 2023-04-03 DIAGNOSIS — M654 Radial styloid tenosynovitis [de Quervain]: Secondary | ICD-10-CM | POA: Diagnosis not present

## 2023-04-15 DIAGNOSIS — M654 Radial styloid tenosynovitis [de Quervain]: Secondary | ICD-10-CM | POA: Diagnosis not present

## 2023-04-15 DIAGNOSIS — M25532 Pain in left wrist: Secondary | ICD-10-CM | POA: Diagnosis not present
